# Patient Record
Sex: Male | Born: 1985 | Race: White | Hispanic: No | Marital: Married | Smoking: Never smoker
Health system: Southern US, Community
[De-identification: ages and names within clinical notes are randomized; demographics above are authoritative.]

## PROBLEM LIST (undated history)

## (undated) DIAGNOSIS — F909 Attention-deficit hyperactivity disorder, unspecified type: Secondary | ICD-10-CM

## (undated) DIAGNOSIS — F1120 Opioid dependence, uncomplicated: Secondary | ICD-10-CM

## (undated) DIAGNOSIS — J45909 Unspecified asthma, uncomplicated: Secondary | ICD-10-CM

## (undated) HISTORY — PX: TYMPANOSTOMY TUBE PLACEMENT: SHX32

## (undated) HISTORY — PX: HAND SURGERY: SHX662

## (undated) HISTORY — PX: KNEE SURGERY: SHX244

## (undated) HISTORY — PX: APPENDECTOMY: SHX54

---

## 2019-07-22 ENCOUNTER — Other Ambulatory Visit: Payer: Self-pay

## 2019-07-22 DIAGNOSIS — Z5321 Procedure and treatment not carried out due to patient leaving prior to being seen by health care provider: Secondary | ICD-10-CM | POA: Insufficient documentation

## 2019-07-22 DIAGNOSIS — R29818 Other symptoms and signs involving the nervous system: Secondary | ICD-10-CM | POA: Insufficient documentation

## 2019-07-23 ENCOUNTER — Emergency Department (HOSPITAL_COMMUNITY)
Admission: EM | Admit: 2019-07-23 | Discharge: 2019-07-23 | Disposition: A | Discharge status: Home / Self Care | Payer: Self-pay | Attending: Emergency Medicine | Admitting: Emergency Medicine

## 2019-07-23 NOTE — ED Notes (Signed)
Unable to locate patient multiple times by staff at waiting area.  

## 2019-07-23 NOTE — ED Notes (Signed)
Pt called not found in waiting room

## 2019-07-29 ENCOUNTER — Emergency Department (HOSPITAL_BASED_OUTPATIENT_CLINIC_OR_DEPARTMENT_OTHER): Payer: No Typology Code available for payment source

## 2019-07-29 ENCOUNTER — Emergency Department (HOSPITAL_BASED_OUTPATIENT_CLINIC_OR_DEPARTMENT_OTHER)
Admission: EM | Admit: 2019-07-29 | Discharge: 2019-07-30 | Disposition: A | Discharge status: Home / Self Care | Payer: No Typology Code available for payment source | Attending: Emergency Medicine | Admitting: Emergency Medicine

## 2019-07-29 ENCOUNTER — Encounter (HOSPITAL_BASED_OUTPATIENT_CLINIC_OR_DEPARTMENT_OTHER): Payer: Self-pay | Admitting: Emergency Medicine

## 2019-07-29 ENCOUNTER — Other Ambulatory Visit: Payer: Self-pay

## 2019-07-29 DIAGNOSIS — F17220 Nicotine dependence, chewing tobacco, uncomplicated: Secondary | ICD-10-CM | POA: Insufficient documentation

## 2019-07-29 DIAGNOSIS — S42021A Displaced fracture of shaft of right clavicle, initial encounter for closed fracture: Secondary | ICD-10-CM | POA: Diagnosis not present

## 2019-07-29 DIAGNOSIS — Y92410 Unspecified street and highway as the place of occurrence of the external cause: Secondary | ICD-10-CM | POA: Diagnosis not present

## 2019-07-29 DIAGNOSIS — Y999 Unspecified external cause status: Secondary | ICD-10-CM | POA: Insufficient documentation

## 2019-07-29 DIAGNOSIS — S70211A Abrasion, right hip, initial encounter: Secondary | ICD-10-CM | POA: Diagnosis not present

## 2019-07-29 DIAGNOSIS — Y939 Activity, unspecified: Secondary | ICD-10-CM | POA: Insufficient documentation

## 2019-07-29 DIAGNOSIS — S301XXA Contusion of abdominal wall, initial encounter: Secondary | ICD-10-CM | POA: Insufficient documentation

## 2019-07-29 DIAGNOSIS — S50311A Abrasion of right elbow, initial encounter: Secondary | ICD-10-CM | POA: Insufficient documentation

## 2019-07-29 DIAGNOSIS — T07XXXA Unspecified multiple injuries, initial encounter: Secondary | ICD-10-CM

## 2019-07-29 DIAGNOSIS — R079 Chest pain, unspecified: Secondary | ICD-10-CM | POA: Insufficient documentation

## 2019-07-29 DIAGNOSIS — S4991XA Unspecified injury of right shoulder and upper arm, initial encounter: Secondary | ICD-10-CM | POA: Diagnosis present

## 2019-07-29 LAB — CBC WITH DIFFERENTIAL/PLATELET
Abs Immature Granulocytes: 0.08 10*3/uL — ABNORMAL HIGH (ref 0.00–0.07)
Basophils Absolute: 0.1 10*3/uL (ref 0.0–0.1)
Basophils Relative: 1 %
Eosinophils Absolute: 0.1 10*3/uL (ref 0.0–0.5)
Eosinophils Relative: 1 %
HCT: 48.2 % (ref 39.0–52.0)
Hemoglobin: 15.9 g/dL (ref 13.0–17.0)
Immature Granulocytes: 1 %
Lymphocytes Relative: 19 %
Lymphs Abs: 2 10*3/uL (ref 0.7–4.0)
MCH: 29 pg (ref 26.0–34.0)
MCHC: 33 g/dL (ref 30.0–36.0)
MCV: 87.8 fL (ref 80.0–100.0)
Monocytes Absolute: 1 10*3/uL (ref 0.1–1.0)
Monocytes Relative: 9 %
Neutro Abs: 7.3 10*3/uL (ref 1.7–7.7)
Neutrophils Relative %: 69 %
Platelets: 215 10*3/uL (ref 150–400)
RBC: 5.49 MIL/uL (ref 4.22–5.81)
RDW: 12.5 % (ref 11.5–15.5)
WBC: 10.5 10*3/uL (ref 4.0–10.5)
nRBC: 0 % (ref 0.0–0.2)

## 2019-07-29 LAB — COMPREHENSIVE METABOLIC PANEL
ALT: 69 U/L — ABNORMAL HIGH (ref 0–44)
AST: 36 U/L (ref 15–41)
Albumin: 4.1 g/dL (ref 3.5–5.0)
Alkaline Phosphatase: 57 U/L (ref 38–126)
Anion gap: 10 (ref 5–15)
BUN: 18 mg/dL (ref 6–20)
CO2: 25 mmol/L (ref 22–32)
Calcium: 8.9 mg/dL (ref 8.9–10.3)
Chloride: 103 mmol/L (ref 98–111)
Creatinine, Ser: 1.03 mg/dL (ref 0.61–1.24)
GFR calc Af Amer: >60 mL/min (ref >60)
GFR calc non Af Amer: >60 mL/min (ref >60)
Glucose, Bld: 78 mg/dL (ref 70–99)
Potassium: 4.4 mmol/L (ref 3.5–5.1)
Sodium: 138 mmol/L (ref 135–145)
Total Bilirubin: 0.4 mg/dL (ref 0.3–1.2)
Total Protein: 7.7 g/dL (ref 6.5–8.1)

## 2019-07-29 LAB — CBG MONITORING, ED: Glucose-Capillary: 101 mg/dL — ABNORMAL HIGH (ref 70–99)

## 2019-07-29 MED ORDER — METHOCARBAMOL 500 MG PO TABS: 500.0000 mg | ORAL_TABLET | Freq: Two times a day (BID) | ORAL | 0 refills | Status: AC | PRN

## 2019-07-29 MED ORDER — FENTANYL CITRATE (PF) 100 MCG/2ML IJ SOLN
100.0000 ug | Freq: Once | INTRAMUSCULAR | Status: AC
  Administered 2019-07-29: 100 ug via INTRAVENOUS

## 2019-07-29 MED ORDER — SODIUM CHLORIDE 0.9 % IV SOLN: Freq: Once | INTRAVENOUS | Status: AC

## 2019-07-29 MED ORDER — HYDROMORPHONE HCL 1 MG/ML IJ SOLN
1.0000 mg | Freq: Once | INTRAMUSCULAR | Status: AC
  Administered 2019-07-29: 1 mg via INTRAVENOUS
  Filled 2019-07-29: qty 1

## 2019-07-29 MED ORDER — IOHEXOL 300 MG/ML  SOLN
70.0000 mL | Freq: Once | INTRAMUSCULAR | Status: AC | PRN
  Administered 2019-07-29: 70 mL via INTRAVENOUS

## 2019-07-29 MED ORDER — HYDROMORPHONE HCL 1 MG/ML IJ SOLN
0.5000 mg | Freq: Once | INTRAMUSCULAR | Status: AC
  Administered 2019-07-29: 0.5 mg via INTRAVENOUS
  Filled 2019-07-29: qty 1

## 2019-07-29 MED ORDER — SODIUM CHLORIDE 0.9 % IV BOLUS
1000.0000 mL | Freq: Once | INTRAVENOUS | Status: AC
  Administered 2019-07-29: 1000 mL via INTRAVENOUS

## 2019-07-29 MED ORDER — KETOROLAC TROMETHAMINE 30 MG/ML IJ SOLN
30.0000 mg | Freq: Once | INTRAMUSCULAR | Status: AC
  Administered 2019-07-29: 30 mg via INTRAVENOUS
  Filled 2019-07-29: qty 1

## 2019-07-29 MED ORDER — IOHEXOL 300 MG/ML  SOLN: 100.0000 mL | Freq: Once | INTRAMUSCULAR | Status: DC | PRN

## 2019-07-29 MED ORDER — IOHEXOL 300 MG/ML  SOLN
100.0000 mL | Freq: Once | INTRAMUSCULAR | Status: AC | PRN
  Administered 2019-07-29: 80 mL via INTRAVENOUS

## 2019-07-29 MED ORDER — FENTANYL CITRATE (PF) 100 MCG/2ML IJ SOLN
100.0000 ug | Freq: Once | INTRAMUSCULAR | Status: DC
  Filled 2019-07-29: qty 2

## 2019-07-29 MED ORDER — FENTANYL CITRATE (PF) 100 MCG/2ML IJ SOLN: 50.0000 ug | Freq: Once | INTRAMUSCULAR | Status: DC

## 2019-07-29 MED ORDER — HYDROCODONE-ACETAMINOPHEN 5-325 MG PO TABS: 1.0000 | ORAL_TABLET | Freq: Four times a day (QID) | ORAL | 0 refills | Status: AC | PRN

## 2019-07-29 NOTE — Discharge Instructions (Addendum)
Take ibuprofen 3 times a day with meals.  Do not take other anti-inflammatories at the same time (Advil, Motrin, naproxen, Aleve). You may supplement with Tylenol if you need further pain control. Use Norco as needed for severe breakthrough pain.  Have caution, this may make you tired or groggy.  Do not drive or operate heavy machinery while taking this medicine. Use robaxin as needed for muscle stiffness or soreness.  This may also make you tired/groggy, I recommend you separate this medicine and Norco by several hours.   Use ice packs for pain and swelling. Wash the abrasions with soap and water daily.  You may keep uncovered, or apply a nonstick dressing. Call the orthopedic doctor listed below for follow up regarding your collar bone fracture.  You will likely have continued muscle stiffness and soreness over the next couple days.  Return to the emergency room if you develop vision changes, vomiting, slurred speech, numbness, loss of bowel or bladder control, or any new or worsening symptoms.

## 2019-07-29 NOTE — ED Notes (Signed)
Attempt x 1 for piv left AC unsuccessful. Jon Gills, RN at bedside to attempt [place] piv placement

## 2019-07-29 NOTE — ED Notes (Signed)
Pt sitting on side of bed having immobilizer placed. C/o 9/10 pain. Medicated per MD order. Noted to be pale, diaphoretic. States "I don't feel good". EDPA Sophia made aware
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
## 2019-07-29 NOTE — ED Notes (Signed)
ED Provider at bedside; Sophia, EDPA

## 2019-07-29 NOTE — ED Notes (Signed)
Pt c/o increasing pain and SOB. MD Pfeiffer made aware

## 2019-07-29 NOTE — ED Notes (Signed)
ED Provider at bedside. (PA student) 

## 2019-07-29 NOTE — ED Notes (Signed)
Patient transported to radiology

## 2019-07-29 NOTE — ED Notes (Signed)
Dr. Donnald Garre ED Provider at bedside.
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
## 2019-08-01 ENCOUNTER — Ambulatory Visit: Payer: Self-pay | Admitting: Orthopaedic Surgery

## 2019-08-02 ENCOUNTER — Encounter: Payer: Self-pay | Admitting: Orthopaedic Surgery

## 2019-08-02 ENCOUNTER — Ambulatory Visit: Payer: Self-pay

## 2019-08-02 ENCOUNTER — Ambulatory Visit (INDEPENDENT_AMBULATORY_CARE_PROVIDER_SITE_OTHER): Payer: Self-pay | Admitting: Orthopaedic Surgery

## 2019-08-02 DIAGNOSIS — S42021A Displaced fracture of shaft of right clavicle, initial encounter for closed fracture: Secondary | ICD-10-CM

## 2019-08-02 MED ORDER — HYDROCODONE-ACETAMINOPHEN 5-325 MG PO TABS: 1.0000 | ORAL_TABLET | Freq: Two times a day (BID) | ORAL | 0 refills | Status: AC | PRN
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
## 2019-08-03 ENCOUNTER — Other Ambulatory Visit: Payer: Self-pay | Admitting: Family

## 2019-08-07 ENCOUNTER — Encounter (HOSPITAL_COMMUNITY): Payer: Self-pay | Admitting: Orthopaedic Surgery

## 2019-08-07 ENCOUNTER — Other Ambulatory Visit: Payer: Self-pay

## 2019-08-07 DIAGNOSIS — S42021A Displaced fracture of shaft of right clavicle, initial encounter for closed fracture: Secondary | ICD-10-CM

## 2019-08-07 NOTE — Progress Notes (Signed)
Spoke with pt for pre-op call. Pt denies cardiac history, HTN or Diabetes.   Pt will need a Covid test on arrival.   ERAS protocol ordered, pt instructed not to eat food after midnight, but may have clear liquids until 7:30 AM. Reviewed list of clear liquids with pt and he voiced understanding. Pt lives in [place] and cannot come today to to pick up RX due to distance

## 2019-08-07 NOTE — Discharge Instructions (Signed)

## 2019-08-08 ENCOUNTER — Ambulatory Visit (HOSPITAL_COMMUNITY): Payer: Self-pay | Admitting: Anesthesiology

## 2019-08-08 ENCOUNTER — Ambulatory Visit (HOSPITAL_COMMUNITY)
Admission: RE | Admit: 2019-08-08 | Discharge: 2019-08-08 | Disposition: A | Discharge status: Home / Self Care | Payer: Self-pay | Attending: Orthopaedic Surgery | Admitting: Orthopaedic Surgery

## 2019-08-08 ENCOUNTER — Encounter (HOSPITAL_COMMUNITY): Payer: Self-pay | Admitting: Orthopaedic Surgery

## 2019-08-08 ENCOUNTER — Encounter (HOSPITAL_COMMUNITY)
Admission: RE | Disposition: A | Discharge status: Home / Self Care | Payer: Self-pay | Source: Home / Self Care | Attending: Orthopaedic Surgery

## 2019-08-08 ENCOUNTER — Other Ambulatory Visit: Payer: Self-pay

## 2019-08-08 ENCOUNTER — Ambulatory Visit (HOSPITAL_COMMUNITY): Payer: Self-pay

## 2019-08-08 DIAGNOSIS — Z419 Encounter for procedure for purposes other than remedying health state, unspecified: Secondary | ICD-10-CM

## 2019-08-08 DIAGNOSIS — F909 Attention-deficit hyperactivity disorder, unspecified type: Secondary | ICD-10-CM | POA: Insufficient documentation

## 2019-08-08 DIAGNOSIS — Z20822 Contact with and (suspected) exposure to covid-19: Secondary | ICD-10-CM | POA: Insufficient documentation

## 2019-08-08 DIAGNOSIS — J45909 Unspecified asthma, uncomplicated: Secondary | ICD-10-CM | POA: Insufficient documentation

## 2019-08-08 DIAGNOSIS — Z79899 Other long term (current) drug therapy: Secondary | ICD-10-CM | POA: Insufficient documentation

## 2019-08-08 DIAGNOSIS — Z9889 Other specified postprocedural states: Secondary | ICD-10-CM

## 2019-08-08 DIAGNOSIS — S42021A Displaced fracture of shaft of right clavicle, initial encounter for closed fracture: Secondary | ICD-10-CM

## 2019-08-08 DIAGNOSIS — S42031A Displaced fracture of lateral end of right clavicle, initial encounter for closed fracture: Secondary | ICD-10-CM | POA: Insufficient documentation

## 2019-08-08 DIAGNOSIS — X58XXXA Exposure to other specified factors, initial encounter: Secondary | ICD-10-CM | POA: Insufficient documentation

## 2019-08-08 HISTORY — PX: ORIF CLAVICULAR FRACTURE: SHX5055

## 2019-08-08 HISTORY — DX: Attention-deficit hyperactivity disorder, unspecified type: F90.9

## 2019-08-08 HISTORY — DX: Unspecified asthma, uncomplicated: J45.909

## 2019-08-08 LAB — SARS CORONAVIRUS 2 BY RT PCR (HOSPITAL ORDER, PERFORMED IN ~~LOC~~ HOSPITAL LAB): SARS Coronavirus 2: NEGATIVE

## 2019-08-08 SURGERY — OPEN REDUCTION INTERNAL FIXATION (ORIF) CLAVICULAR FRACTURE
Anesthesia: General | Laterality: Right

## 2019-08-08 MED ORDER — METHOCARBAMOL 500 MG PO TABS
ORAL_TABLET | ORAL | Status: AC
  Filled 2019-08-08: qty 1

## 2019-08-08 MED ORDER — DEXAMETHASONE SODIUM PHOSPHATE 10 MG/ML IJ SOLN
INTRAMUSCULAR | Status: DC | PRN
  Administered 2019-08-08: 5 mg via INTRAVENOUS

## 2019-08-08 MED ORDER — SODIUM CHLORIDE 0.9 % IR SOLN
Status: DC | PRN
  Administered 2019-08-08: 1000 mL

## 2019-08-08 MED ORDER — FENTANYL CITRATE (PF) 100 MCG/2ML IJ SOLN
INTRAMUSCULAR | Status: AC
  Filled 2019-08-08: qty 2

## 2019-08-08 MED ORDER — FENTANYL CITRATE (PF) 250 MCG/5ML IJ SOLN
INTRAMUSCULAR | Status: DC | PRN
  Administered 2019-08-08 (×2): 50 ug via INTRAVENOUS
  Administered 2019-08-08: 100 ug via INTRAVENOUS
  Administered 2019-08-08: 50 ug via INTRAVENOUS

## 2019-08-08 MED ORDER — PROPOFOL 10 MG/ML IV BOLUS
INTRAVENOUS | Status: DC | PRN
  Administered 2019-08-08: 150 mg via INTRAVENOUS

## 2019-08-08 MED ORDER — CELECOXIB 200 MG PO CAPS: 200.0000 mg | ORAL_CAPSULE | Freq: Once | ORAL | Status: DC

## 2019-08-08 MED ORDER — TRANEXAMIC ACID 1000 MG/10ML IV SOLN
INTRAVENOUS | Status: DC | PRN
  Administered 2019-08-08: 2000 mg via TOPICAL

## 2019-08-08 MED ORDER — ONDANSETRON HCL 4 MG/2ML IJ SOLN
INTRAMUSCULAR | Status: DC | PRN
  Administered 2019-08-08: 4 mg via INTRAVENOUS

## 2019-08-08 MED ORDER — ROCURONIUM BROMIDE 10 MG/ML (PF) SYRINGE
PREFILLED_SYRINGE | INTRAVENOUS | Status: DC | PRN
  Administered 2019-08-08: 20 mg via INTRAVENOUS
  Administered 2019-08-08: 50 mg via INTRAVENOUS

## 2019-08-08 MED ORDER — AMISULPRIDE (ANTIEMETIC) 5 MG/2ML IV SOLN: 10.0000 mg | Freq: Once | INTRAVENOUS | Status: DC | PRN

## 2019-08-08 MED ORDER — LACTATED RINGERS IV SOLN: INTRAVENOUS | Status: DC

## 2019-08-08 MED ORDER — BUPIVACAINE HCL (PF) 0.25 % IJ SOLN
INTRAMUSCULAR | Status: AC
  Filled 2019-08-08: qty 30

## 2019-08-08 MED ORDER — CHLORHEXIDINE GLUCONATE 0.12 % MT SOLN
15.0000 mL | Freq: Once | OROMUCOSAL | Status: AC
  Administered 2019-08-08: 15 mL via OROMUCOSAL
  Filled 2019-08-08: qty 15

## 2019-08-08 MED ORDER — OXYCODONE-ACETAMINOPHEN 5-325 MG PO TABS: 1.0000 | ORAL_TABLET | Freq: Three times a day (TID) | ORAL | 0 refills | Status: AC | PRN

## 2019-08-08 MED ORDER — MIDAZOLAM HCL 2 MG/2ML IJ SOLN
INTRAMUSCULAR | Status: DC | PRN
  Administered 2019-08-08: 2 mg via INTRAVENOUS

## 2019-08-08 MED ORDER — TRANEXAMIC ACID 1000 MG/10ML IV SOLN
2000.0000 mg | Freq: Once | INTRAVENOUS | Status: DC
  Filled 2019-08-08: qty 20

## 2019-08-08 MED ORDER — BUPIVACAINE HCL (PF) 0.25 % IJ SOLN
INTRAMUSCULAR | Status: DC | PRN
  Administered 2019-08-08: 30 mL

## 2019-08-08 MED ORDER — FENTANYL CITRATE (PF) 100 MCG/2ML IJ SOLN
25.0000 ug | INTRAMUSCULAR | Status: DC | PRN
  Administered 2019-08-08: 50 ug via INTRAVENOUS
  Administered 2019-08-08: 25 ug via INTRAVENOUS

## 2019-08-08 MED ORDER — IBUPROFEN 800 MG PO TABS
800.0000 mg | ORAL_TABLET | Freq: Three times a day (TID) | ORAL | 2 refills | Status: AC | PRN
Start: 2019-08-08 — End: ?

## 2019-08-08 MED ORDER — METHOCARBAMOL 500 MG PO TABS
500.0000 mg | ORAL_TABLET | Freq: Once | ORAL | Status: AC
  Administered 2019-08-08: 500 mg via ORAL

## 2019-08-08 MED ORDER — PROPOFOL 10 MG/ML IV BOLUS
INTRAVENOUS | Status: AC
  Filled 2019-08-08: qty 20

## 2019-08-08 MED ORDER — ORAL CARE MOUTH RINSE: 15.0000 mL | Freq: Once | OROMUCOSAL | Status: AC

## 2019-08-08 MED ORDER — MIDAZOLAM HCL 2 MG/2ML IJ SOLN
INTRAMUSCULAR | Status: AC
  Filled 2019-08-08: qty 2

## 2019-08-08 MED ORDER — CEFAZOLIN SODIUM-DEXTROSE 2-4 GM/100ML-% IV SOLN
2.0000 g | INTRAVENOUS | Status: AC
  Administered 2019-08-08: 2 g via INTRAVENOUS
  Filled 2019-08-08: qty 100

## 2019-08-08 MED ORDER — FENTANYL CITRATE (PF) 250 MCG/5ML IJ SOLN
INTRAMUSCULAR | Status: AC
  Filled 2019-08-08: qty 5

## 2019-08-08 MED ORDER — METHOCARBAMOL 750 MG PO TABS: 750.0000 mg | ORAL_TABLET | Freq: Two times a day (BID) | ORAL | 3 refills | Status: AC | PRN

## 2019-08-08 MED ORDER — SUGAMMADEX SODIUM 200 MG/2ML IV SOLN
INTRAVENOUS | Status: DC | PRN
  Administered 2019-08-08: 300 mg via INTRAVENOUS

## 2019-08-08 MED ORDER — LIDOCAINE 2% (20 MG/ML) 5 ML SYRINGE
INTRAMUSCULAR | Status: DC | PRN
  Administered 2019-08-08: 60 mg via INTRAVENOUS

## 2019-08-08 MED ORDER — DEXMEDETOMIDINE HCL IN NACL 200 MCG/50ML IV SOLN
INTRAVENOUS | Status: DC | PRN
Start: 2019-08-08 — End: 2019-08-08
  Administered 2019-08-08 (×2): 12 ug via INTRAVENOUS

## 2019-08-08 SURGICAL SUPPLY — 42 items
BIT DRILL CLAV 2.2X135 (BIT) ×2
BIT DRILL CLAV ALPS 2.7X145 (BIT) ×3 IMPLANT
BIT DRILL CLAV LONG 2.2X135 (BIT) ×1 IMPLANT
COVER SURGICAL LIGHT HANDLE (MISCELLANEOUS) ×3 IMPLANT
DRAPE C-ARM 42X72 X-RAY (DRAPES) ×3 IMPLANT
DRAPE U-SHAPE 47X51 STRL (DRAPES) ×3 IMPLANT
DURAPREP 26ML APPLICATOR (WOUND CARE) ×6 IMPLANT
ELECT REM PT RETURN 9FT ADLT (ELECTROSURGICAL) ×3
ELECTRODE REM PT RTRN 9FT ADLT (ELECTROSURGICAL) ×1 IMPLANT
GAUZE SPONGE 4X4 12PLY STRL (GAUZE/BANDAGES/DRESSINGS) ×3 IMPLANT
GLOVE BIOGEL PI IND STRL 7.0 (GLOVE) ×1 IMPLANT
GLOVE BIOGEL PI INDICATOR 7.0 (GLOVE) ×2
GLOVE ECLIPSE 7.0 STRL STRAW (GLOVE) ×3 IMPLANT
GLOVE SKINSENSE NS SZ7.5 (GLOVE) ×4
GLOVE SKINSENSE STRL SZ7.5 (GLOVE) ×2 IMPLANT
GOWN STRL REIN XL XLG (GOWN DISPOSABLE) ×6 IMPLANT
K-WIRE TROCHAR TIP ALPS 1.6 (WIRE) ×6
KIT BASIN OR (CUSTOM PROCEDURE TRAY) ×3 IMPLANT
KIT TURNOVER KIT B (KITS) ×3 IMPLANT
KWIRE TROCHAR TIP ALPS 1.6 (WIRE) ×2 IMPLANT
MANIFOLD NEPTUNE II (INSTRUMENTS) ×3 IMPLANT
NS IRRIG 1000ML POUR BTL (IV SOLUTION) ×3 IMPLANT
PACK SHOULDER (CUSTOM PROCEDURE TRAY) ×3 IMPLANT
PLATE CLAV SUP 110 10H RT (Plate) ×3 IMPLANT
SCREW  LP NL 2.7X16MM (Screw) ×2 IMPLANT
SCREW CORT LP 3.5X14 (Screw) ×9 IMPLANT
SCREW CORT LP T15 3.5X16 (Screw) ×3 IMPLANT
SCREW LP NL 2.7X16MM (Screw) ×1 IMPLANT
SCREW TIS LP 3.5X18 NS (Screw) ×9 IMPLANT
SLING ARM FOAM STRAP LRG (SOFTGOODS) ×3 IMPLANT
SPONGE LAP 18X18 X RAY DECT (DISPOSABLE) ×3 IMPLANT
SUCTION FRAZIER HANDLE 10FR (MISCELLANEOUS) ×2
SUCTION TUBE FRAZIER 10FR DISP (MISCELLANEOUS) ×1 IMPLANT
SUT MNCRL AB 3-0 PS2 27 (SUTURE) ×3 IMPLANT
SUT MNCRL AB 4-0 PS2 18 (SUTURE) ×3 IMPLANT
SUT VIC AB 0 CT1 27 (SUTURE) ×2
SUT VIC AB 0 CT1 27XBRD ANBCTR (SUTURE) ×1 IMPLANT
SUT VIC AB 2-0 CT1 27 (SUTURE) ×6
SUT VIC AB 2-0 CT1 TAPERPNT 27 (SUTURE) ×3 IMPLANT
SYR CONTROL 10ML LL (SYRINGE) ×3 IMPLANT
TAPE STRIPS DRAPE STRL (GAUZE/BANDAGES/DRESSINGS) ×3 IMPLANT
UNDERPAD 30X36 HEAVY ABSORB (UNDERPADS AND DIAPERS) ×3 IMPLANT

## 2019-08-08 NOTE — H&P (Signed)
PREOPERATIVE H&P  Chief Complaint: right clavicle fracture  HPI: Joel Hudson is a 34 y.o. male who presents for surgical treatment of right clavicle fracture.  He denies any changes in medical history.  Past Medical History:  Diagnosis Date  . ADHD   . Asthma    as a small child   Past Surgical History:  Procedure Laterality Date  . APPENDECTOMY    . HAND SURGERY Left    fracture   . KNEE SURGERY    . TYMPANOSTOMY TUBE PLACEMENT     Social History   Socioeconomic History  . Marital status: Married    Spouse name: Not on file  . Number of children: Not on file  . Years of education: Not on file  . Highest education level: Not on file  Occupational History  . Not on file  Tobacco Use  . Smoking status: Never Smoker  . Smokeless tobacco: Current User    Types: Snuff  Vaping Use  . Vaping Use: Never used  Substance and Sexual Activity  . Alcohol use: Yes    Comment: occ  . Drug use: Never  . Sexual activity: Not on file  Other Topics Concern  . Not on file  Social History Narrative  . Not on file   Social Determinants of Health   Financial Resource Strain:   . Difficulty of Paying Living Expenses:   Food Insecurity:   . Worried About Programme researcher, broadcasting/film/video in the Last Year:   . Barista in the Last Year:   Transportation Needs:   . Freight forwarder (Medical):   Marland Kitchen Lack of Transportation (Non-Medical):   Physical Activity:   . Days of Exercise per Week:   . Minutes of Exercise per Session:   Stress:   . Feeling of Stress :   Social Connections:   . Frequency of Communication with Friends and Family:   . Frequency of Social Gatherings with Friends and Family:   . Attends Religious Services:   . Active Member of Clubs or Organizations:   . Attends Banker Meetings:   Marland Kitchen Marital Status:    History reviewed. No pertinent family history. No Known Allergies Prior to Admission medications   Medication Sig Start Date End Date  Taking? Authorizing Provider  amphetamine-dextroamphetamine (ADDERALL) 20 MG tablet Take 20 mg by mouth daily as needed (extra focus).   Yes [provider]  HYDROcodone-acetaminophen (NORCO/VICODIN) 5-325 MG tablet Take 1 tablet by mouth every 6 (six) hours as needed for severe pain. 07/29/19  Yes Caccavale, Sophia, PA-C  methocarbamol (ROBAXIN) 500 MG tablet Take 1 tablet (500 mg total) by mouth 2 (two) times daily as needed for muscle spasms. 07/29/19  Yes Caccavale, Sophia, PA-C  HYDROcodone-acetaminophen (NORCO) 5-325 MG tablet Take 1-2 tablets by mouth 2 (two) times daily as needed. 08/02/19   Tarry Kos, MD     Positive ROS: All other systems have been reviewed and were otherwise negative with the exception of those mentioned in the HPI and as above.  Physical Exam: General: Alert, no acute distress Cardiovascular: No pedal edema Respiratory: No cyanosis, no use of accessory musculature GI: abdomen soft Skin: No lesions in the area of chief complaint Neurologic: Sensation intact distally Psychiatric: Patient is competent for consent with normal mood and affect Lymphatic: no lymphedema  MUSCULOSKELETAL: exam stable  Assessment: right clavicle fracture  Plan: Plan for Procedure(s): OPEN REDUCTION INTERNAL FIXATION (ORIF) RIGHT CLAVICLE FRACTURE  The  risks benefits and alternatives were discussed with the patient including but not limited to the risks of nonoperative treatment, versus surgical intervention including infection, bleeding, nerve injury,  blood clots, cardiopulmonary complications, morbidity, mortality, among others, and they were willing to proceed.   Preoperative templating of the joint replacement has been completed, documented, and submitted to the Operating Room personnel in order to optimize intra-operative equipment management.   Glee Arvin, MD 08/08/2019 7:22 AM

## 2019-08-08 NOTE — Anesthesia Procedure Notes (Signed)
Procedure Name: Intubation Date/Time: 08/08/2019 11:08 AM Performed by: Drema Pry, CRNA Pre-anesthesia Checklist: Patient identified, Emergency Drugs available, Suction available and Patient being monitored Patient Re-evaluated:Patient Re-evaluated prior to induction Oxygen Delivery Method: Circle System Utilized Preoxygenation: Pre-oxygenation with 100% oxygen Induction Type: IV induction Ventilation: Oral airway inserted - appropriate to patient size Laryngoscope Size: Glidescope Grade View: Grade I Tube type: Oral Tube size: 7.5 mm Number of attempts: 1 Airway Equipment and Method: Oral airway,  Video-laryngoscopy and Stylet Placement Confirmation: ETT inserted through vocal cords under direct vision,  positive ETCO2 and breath sounds checked- equal and bilateral Secured at: 22 cm Tube secured with: Tape Dental Injury: Teeth and Oropharynx as per pre-operative assessment  Comments: Elective Glidescope for educational purposes. Intubation by L. Ferm, SRNA

## 2019-08-08 NOTE — Anesthesia Preprocedure Evaluation (Signed)
Anesthesia Evaluation  Patient identified by MRN, date of birth, ID band Patient awake    Reviewed: Allergy & Precautions, NPO status , Patient's Chart, lab work & pertinent test results  Airway Mallampati: II  TM Distance: >3 FB Neck ROM: Full    Dental  (+) Dental Advisory Given   Pulmonary asthma ,    breath sounds clear to auscultation       Cardiovascular negative cardio ROS   Rhythm:Regular Rate:Normal     Neuro/Psych negative neurological ROS     GI/Hepatic negative GI ROS, Neg liver ROS,   Endo/Other  negative endocrine ROS  Renal/GU negative Renal ROS     Musculoskeletal   Abdominal   Peds  Hematology negative hematology ROS (+)   Anesthesia Other Findings   Reproductive/Obstetrics                             Anesthesia Physical Anesthesia Plan  ASA: I  Anesthesia Plan: General   Post-op Pain Management:    Induction: Intravenous  PONV Risk Score and Plan: 2 and Dexamethasone, Ondansetron and Treatment may vary due to age or medical condition  Airway Management Planned: Oral ETT  Additional Equipment: None  Intra-op Plan:   Post-operative Plan: Extubation in OR  Informed Consent: I have reviewed the patients History and Physical, chart, labs and discussed the procedure including the risks, benefits and alternatives for the proposed anesthesia with the patient or authorized representative who has indicated his/her understanding and acceptance.     Dental advisory given  Plan Discussed with: CRNA  Anesthesia Plan Comments:         Anesthesia Quick Evaluation

## 2019-08-08 NOTE — Transfer of Care (Signed)
Immediate Anesthesia Transfer of Care Note  Patient: Joel Hudson  Procedure(s) Performed: OPEN REDUCTION INTERNAL FIXATION (ORIF) RIGHT CLAVICLE FRACTURE (Right )  Patient Location: PACU  Anesthesia Type:General  Level of Consciousness: drowsy, patient cooperative and responds to stimulation  Airway & Oxygen Therapy: Patient Spontanous Breathing  Post-op Assessment: Report given to RN and Post -op Vital signs reviewed and stable  Post vital signs: Reviewed and stable  Last Vitals:  Vitals Value Taken Time  BP 132/96 08/08/19 1313  Temp    Pulse 87 08/08/19 1314  Resp 17 08/08/19 1314  SpO2 99 % 08/08/19 1314  Vitals shown include unvalidated device data.  Last Pain:  Vitals:   08/08/19 0858  TempSrc:   PainSc: 5          Complications: No complications documented.

## 2019-08-08 NOTE — Op Note (Signed)
   Date of Surgery: 08/08/2019  INDICATIONS: Mr. Coppa is a 34 y.o.-year-old male who sustained a right clavicle fracture;  The patient did consent to the procedure after discussion of the risks and benefits.  PREOPERATIVE DIAGNOSIS: Displaced communited right clavicle fracture  POSTOPERATIVE DIAGNOSIS: Same.  PROCEDURE: Open treatment of right clavicle fracture with internal fixation  SURGEON: N. Glee Arvin, M.D.  ASSIST: Starlyn Skeans Eek, [place]; necessary for the timely completion of procedure and due to complexity of procedure..  ANESTHESIA:  general, local  IV FLUIDS AND URINE: See anesthesia.  ESTIMATED BLOOD LOSS: minimal mL.  IMPLANTS: Biomet superior plate  COMPLICATIONS: None.  DESCRIPTION OF PROCEDURE: The patient was brought to the operating room and placed supine on the operating table.  The patient had been signed prior to the procedure and this was documented. The patient had the anesthesia placed by the anesthesiologist.  The patient was then placed in the beach chair position.  All bony prominences were well padded.  A time-out was performed to confirm that this was the correct patient, site, side and location. The patient had an SCD on the lower extremities. The patient did receive antibiotics prior to the incision and was re-dosed during the procedure as needed at indicated intervals.  The patient had the operative extremity prepped and draped in the standard surgical fashion.    A horizontal incision based over the clavicle was used.  Cutaneous nerves were identified and protected as much as possible.  Full thickness flaps were elevated off of the clavicle.  The fracture was exposed.  Any organized hematoma and entrapped periosteum was retrieved from the fracture site.  There was a large anterior butterfly fragment that I lagged back to the distal segment using a 2.7 millimeters screw which gave excellent fixation.  I then obtained a reduction with the medial segment  using a tenaculum clamp.  I was not able to place the lag screw but the fracture stayed reduced.  A 10 hole superior plate was then placed on top of the clavicle and checked under fluoroscopy..  The appropriate length was found by using fluoroscopy.  With the plate in the appropriate position and the fracture reduced.  Nonlocking screws were placed through the plate and into the clavicle.  Care was taken not to plunge with any of the instruments.  Retractors were used as added protection to the neurovascular and pulmonary structures.  Final fluoroscopy pictures were taken to confirm plate placement and fracture reduction.  The wound was thoroughly irrigated and closed in a layer fashion using 0 vicryl, 2.0 vicryl and 3.0 monocryl.  Sterile dressings were applied and the patient was extubated and transferred to the PACU in stable condition.  POSTOPERATIVE PLAN: Patient will be in a sling for comfort.  He is allowed to range his shoulder up to the level of the shoulder and not allowed to lift anything.  Observation overnight for pain control and discharge home in the morning.  Mayra Reel, MD [facility] 12:37 PM

## 2019-08-09 ENCOUNTER — Encounter (HOSPITAL_COMMUNITY): Payer: Self-pay | Admitting: Orthopaedic Surgery

## 2019-08-09 NOTE — Anesthesia Postprocedure Evaluation (Signed)
Anesthesia Post Note  Patient: Joel Hudson  Procedure(s) Performed: OPEN REDUCTION INTERNAL FIXATION (ORIF) RIGHT CLAVICLE FRACTURE (Right )     Patient location during evaluation: PACU Anesthesia Type: General Level of consciousness: awake and alert Pain management: pain level controlled Vital Signs Assessment: post-procedure vital signs reviewed and stable Respiratory status: spontaneous breathing, nonlabored ventilation, respiratory function stable and patient connected to nasal cannula oxygen Cardiovascular status: blood pressure returned to baseline and stable Postop Assessment: no apparent nausea or vomiting Anesthetic complications: no   No complications documented.  Last Vitals:  Vitals:   08/08/19 1400 08/08/19 1410  BP: 122/79 125/66  Pulse: 88 89  Resp: 15 14  Temp:    SpO2: 97% 99%    Last Pain:  Vitals:   08/08/19 1400  TempSrc:   PainSc: 3                  Kennieth Rad

## 2019-08-22 ENCOUNTER — Inpatient Hospital Stay: Payer: Self-pay | Admitting: Orthopaedic Surgery

## 2019-08-28 ENCOUNTER — Encounter: Payer: Self-pay | Admitting: Orthopaedic Surgery

## 2019-08-28 ENCOUNTER — Ambulatory Visit (INDEPENDENT_AMBULATORY_CARE_PROVIDER_SITE_OTHER): Payer: Self-pay

## 2019-08-28 ENCOUNTER — Ambulatory Visit (INDEPENDENT_AMBULATORY_CARE_PROVIDER_SITE_OTHER): Payer: Self-pay | Admitting: Orthopaedic Surgery

## 2019-08-28 DIAGNOSIS — S42021A Displaced fracture of shaft of right clavicle, initial encounter for closed fracture: Secondary | ICD-10-CM

## 2019-08-28 NOTE — Progress Notes (Signed)
   Post-Op Visit Note   Patient: Joel Hudson           Date of Birth: 02-24-1985           MRN: [id number] Visit Date: 08/28/2019 PCP: Patient, No Pcp Per   Assessment & Plan:  Chief Complaint:  Chief Complaint  Patient presents with  . Right Shoulder - Pain   Visit Diagnoses:  1. Displaced fracture of shaft of right clavicle, initial encounter for closed fracture     Plan: Joel Hudson is a 34 year old gentleman who is approximately 3 weeks status post ORIF right clavicle fracture.  He reports no pain.  His surgical incision is healed with a couple of stitches to have spat out.  No signs of infection.  No drainage.  He is self-employed Geophysicist/field seismologist.  He admits to doing push-ups and being a little noncompliant with his postoperative restrictions.  I stressed the importance of being compliant in order to mitigate potential complications.  He understands that he is not allowed to lift anything more than a couple pounds or raise his arm above his head.  We will recheck him in 4 weeks with two-view x-rays of the right clavicle.  Follow-Up Instructions: Return in about 4 weeks (around 09/25/2019).   Orders:  Orders Placed This Encounter  Procedures  . XR Clavicle Right   No orders of the defined types were placed in this encounter.   Imaging: XR Clavicle Right  Result Date: 08/28/2019 Stable fixation and alignment of right clavicle fracture without hardware complications.   PMFS History: Patient Active Problem List   Diagnosis Date Noted  . Displaced fracture of shaft of right clavicle, initial encounter for closed fracture 08/07/2019   Past Medical History:  Diagnosis Date  . ADHD   . Asthma    as a small child    History reviewed. No pertinent family history.  Past Surgical History:  Procedure Laterality Date  . APPENDECTOMY    . HAND SURGERY Left    fracture   . KNEE SURGERY    . ORIF CLAVICULAR FRACTURE Right 08/08/2019   Procedure: OPEN REDUCTION INTERNAL FIXATION (ORIF)  RIGHT CLAVICLE FRACTURE;  Surgeon: Tarry Kos, MD;  Location: [facility] OR;  Service: Orthopedics;  Laterality: Right;  . TYMPANOSTOMY TUBE PLACEMENT     Social History   Occupational History  . Not on file  Tobacco Use  . Smoking status: Never Smoker  . Smokeless tobacco: Current User    Types: Snuff  Vaping Use  . Vaping Use: Never used  Substance and Sexual Activity  . Alcohol use: Yes    Comment: occ  . Drug use: Never  . Sexual activity: Not on file

## 2019-09-25 ENCOUNTER — Ambulatory Visit: Payer: Self-pay | Admitting: Orthopaedic Surgery

## 2019-11-07 ENCOUNTER — Emergency Department (HOSPITAL_BASED_OUTPATIENT_CLINIC_OR_DEPARTMENT_OTHER)
Admission: EM | Admit: 2019-11-07 | Discharge: 2019-11-07 | Disposition: A | Discharge status: Home / Self Care | Payer: Self-pay | Attending: Emergency Medicine | Admitting: Emergency Medicine

## 2019-11-07 ENCOUNTER — Other Ambulatory Visit: Payer: Self-pay

## 2019-11-07 ENCOUNTER — Encounter (HOSPITAL_BASED_OUTPATIENT_CLINIC_OR_DEPARTMENT_OTHER): Payer: Self-pay | Admitting: Emergency Medicine

## 2019-11-07 ENCOUNTER — Emergency Department (HOSPITAL_BASED_OUTPATIENT_CLINIC_OR_DEPARTMENT_OTHER): Payer: Self-pay

## 2019-11-07 DIAGNOSIS — M542 Cervicalgia: Secondary | ICD-10-CM | POA: Insufficient documentation

## 2019-11-07 DIAGNOSIS — J45909 Unspecified asthma, uncomplicated: Secondary | ICD-10-CM | POA: Insufficient documentation

## 2019-11-07 DIAGNOSIS — R519 Headache, unspecified: Secondary | ICD-10-CM | POA: Insufficient documentation

## 2019-11-07 DIAGNOSIS — R202 Paresthesia of skin: Secondary | ICD-10-CM | POA: Insufficient documentation

## 2019-11-07 DIAGNOSIS — H53141 Visual discomfort, right eye: Secondary | ICD-10-CM | POA: Insufficient documentation

## 2019-11-07 DIAGNOSIS — H53149 Visual discomfort, unspecified: Secondary | ICD-10-CM

## 2019-11-07 DIAGNOSIS — F17290 Nicotine dependence, other tobacco product, uncomplicated: Secondary | ICD-10-CM | POA: Insufficient documentation

## 2019-11-07 HISTORY — DX: Opioid dependence, uncomplicated: F11.20

## 2019-11-07 LAB — URINALYSIS, ROUTINE W REFLEX MICROSCOPIC
Bilirubin Urine: NEGATIVE
Glucose, UA: NEGATIVE mg/dL
Hgb urine dipstick: NEGATIVE
Ketones, ur: NEGATIVE mg/dL
Leukocytes,Ua: NEGATIVE
Nitrite: NEGATIVE
Protein, ur: NEGATIVE mg/dL
Specific Gravity, Urine: <1.005 — ABNORMAL LOW (ref 1.005–1.030)
pH: 7.5 (ref 5.0–8.0)

## 2019-11-07 LAB — CBC WITH DIFFERENTIAL/PLATELET
Abs Immature Granulocytes: 0.02 10*3/uL (ref 0.00–0.07)
Basophils Absolute: 0.1 10*3/uL (ref 0.0–0.1)
Basophils Relative: 1 %
Eosinophils Absolute: 0.1 10*3/uL (ref 0.0–0.5)
Eosinophils Relative: 1 %
HCT: 47.7 % (ref 39.0–52.0)
Hemoglobin: 16.1 g/dL (ref 13.0–17.0)
Immature Granulocytes: 0 %
Lymphocytes Relative: 36 %
Lymphs Abs: 2.5 10*3/uL (ref 0.7–4.0)
MCH: 28.4 pg (ref 26.0–34.0)
MCHC: 33.8 g/dL (ref 30.0–36.0)
MCV: 84.1 fL (ref 80.0–100.0)
Monocytes Absolute: 0.7 10*3/uL (ref 0.1–1.0)
Monocytes Relative: 10 %
Neutro Abs: 3.5 10*3/uL (ref 1.7–7.7)
Neutrophils Relative %: 52 %
Platelets: 238 10*3/uL (ref 150–400)
RBC: 5.67 MIL/uL (ref 4.22–5.81)
RDW: 12.5 % (ref 11.5–15.5)
WBC: 6.9 10*3/uL (ref 4.0–10.5)
nRBC: 0 % (ref 0.0–0.2)

## 2019-11-07 LAB — COMPREHENSIVE METABOLIC PANEL
ALT: 85 U/L — ABNORMAL HIGH (ref 0–44)
AST: 48 U/L — ABNORMAL HIGH (ref 15–41)
Albumin: 4.6 g/dL (ref 3.5–5.0)
Alkaline Phosphatase: 63 U/L (ref 38–126)
Anion gap: 11 (ref 5–15)
BUN: 13 mg/dL (ref 6–20)
CO2: 26 mmol/L (ref 22–32)
Calcium: 9.2 mg/dL (ref 8.9–10.3)
Chloride: 100 mmol/L (ref 98–111)
Creatinine, Ser: 1.07 mg/dL (ref 0.61–1.24)
GFR calc non Af Amer: >60 mL/min (ref >60)
Glucose, Bld: 94 mg/dL (ref 70–99)
Potassium: 3.6 mmol/L (ref 3.5–5.1)
Sodium: 137 mmol/L (ref 135–145)
Total Bilirubin: 0.7 mg/dL (ref 0.3–1.2)
Total Protein: 8.4 g/dL — ABNORMAL HIGH (ref 6.5–8.1)

## 2019-11-07 LAB — RAPID URINE DRUG SCREEN, HOSP PERFORMED
Amphetamines: POSITIVE — AB
Barbiturates: NOT DETECTED
Benzodiazepines: NOT DETECTED
Cocaine: NOT DETECTED
Opiates: NOT DETECTED
Tetrahydrocannabinol: NOT DETECTED

## 2019-11-07 LAB — TSH: TSH: 1.417 u[IU]/mL (ref 0.350–4.500)

## 2019-11-07 MED ORDER — IOHEXOL 350 MG/ML SOLN
100.0000 mL | Freq: Once | INTRAVENOUS | Status: AC | PRN
  Administered 2019-11-07: 100 mL via INTRAVENOUS

## 2019-11-07 MED ORDER — SODIUM CHLORIDE 0.9 % IV BOLUS
1000.0000 mL | Freq: Once | INTRAVENOUS | Status: AC
  Administered 2019-11-07: 1000 mL via INTRAVENOUS

## 2019-11-07 NOTE — ED Triage Notes (Addendum)
Pain to R side of head, ear, and face x 1 hour. It is painful to touch. Also reports tingling and numbness. Also reports feeling "foggy" and having varicose veins show up for several days.

## 2019-11-07 NOTE — ED Provider Notes (Signed)
[facility] EMERGENCY DEPARTMENT Provider Note   CSN: [id number] Arrival date & time: 11/07/19  [id number]     History Chief Complaint  Patient presents with  . Headache    Joel Hudson is a 34 y.o. male.  The history is provided by the patient and medical records. No language interpreter was used.  Headache Pain location:  R temporal Radiates to:  R neck Severity currently:  4/10 Pain scale at highest: severe. Onset quality:  Gradual Timing:  Intermittent Progression:  Waxing and waning Chronicity:  Chronic Similar to prior headaches: yes   Context: not exposure to bright light   Relieved by:  Nothing Worsened by:  Nothing Ineffective treatments:  None tried Associated symptoms: blurred vision, focal weakness (resolved), neck pain, numbness (resolved) and weakness (resolved)   Associated symptoms: no abdominal pain, no back pain, no congestion, no cough, no dizziness, no fatigue, no fever, no nausea, no near-syncope, no neck stiffness, no photophobia and no seizures        Past Medical History:  Diagnosis Date  . ADHD   . Asthma    as a small child    Patient Active Problem List   Diagnosis Date Noted  . Displaced fracture of shaft of right clavicle, initial encounter for closed fracture 08/07/2019    Past Surgical History:  Procedure Laterality Date  . APPENDECTOMY    . HAND SURGERY Left    fracture   . KNEE SURGERY    . ORIF CLAVICULAR FRACTURE Right 08/08/2019   Procedure: OPEN REDUCTION INTERNAL FIXATION (ORIF) RIGHT CLAVICLE FRACTURE;  Surgeon: Tarry KosXu, Naiping M, MD;  Location: [facility] OR;  Service: Orthopedics;  Laterality: Right;  . TYMPANOSTOMY TUBE PLACEMENT         No family history on file.  Social History   Tobacco Use  . Smoking status: Never Smoker  . Smokeless tobacco: Current User    Types: Snuff  Vaping Use  . Vaping Use: Never used  Substance Use Topics  . Alcohol use: Yes    Comment: occ  . Drug use: Never    Home  Medications Prior to Admission medications   Medication Sig Start Date End Date Taking? Authorizing Provider  amphetamine-dextroamphetamine (ADDERALL) 20 MG tablet Take 20 mg by mouth daily as needed (extra focus).    [provider]  HYDROcodone-acetaminophen (NORCO) 5-325 MG tablet Take 1-2 tablets by mouth 2 (two) times daily as needed. 08/02/19   Tarry KosXu, Naiping M, MD  HYDROcodone-acetaminophen (NORCO/VICODIN) 5-325 MG tablet Take 1 tablet by mouth every 6 (six) hours as needed for severe pain. 07/29/19   Caccavale, Sophia, PA-C  ibuprofen (ADVIL) 800 MG tablet Take 1 tablet (800 mg total) by mouth every 8 (eight) hours as needed. 08/08/19   Tarry KosXu, Naiping M, MD  methocarbamol (ROBAXIN) 500 MG tablet Take 1 tablet (500 mg total) by mouth 2 (two) times daily as needed for muscle spasms. 07/29/19   Caccavale, Sophia, PA-C  methocarbamol (ROBAXIN) 750 MG tablet Take 1 tablet (750 mg total) by mouth 2 (two) times daily as needed for muscle spasms. 08/08/19   Tarry KosXu, Naiping M, MD  oxyCODONE-acetaminophen (PERCOCET) 5-325 MG tablet Take 1-2 tablets by mouth every 8 (eight) hours as needed for severe pain. 08/08/19   Tarry KosXu, Naiping M, MD    Allergies    Patient has no known allergies.  Review of Systems   Review of Systems  Constitutional: Negative for chills, diaphoresis, fatigue and fever.  HENT: Negative for congestion.  Eyes: Positive for blurred vision and visual disturbance (resolved). Negative for photophobia.  Respiratory: Negative for cough, chest tightness, shortness of breath and wheezing.   Cardiovascular: Negative for chest pain and near-syncope.  Gastrointestinal: Negative for abdominal pain and nausea.  Genitourinary: Negative for dysuria.  Musculoskeletal: Positive for neck pain. Negative for back pain and neck stiffness.  Neurological: Positive for focal weakness (resolved), speech difficulty, weakness (resolved), numbness (resolved) and headaches. Negative for dizziness, seizures, syncope,  facial asymmetry and light-headedness.  Psychiatric/Behavioral: Negative for agitation and confusion.  All other systems reviewed and are negative.   Physical Exam Updated Vital Signs BP 125/84 (BP Location: Left Arm)   Pulse (!) 109   Resp 20   Ht 5\' 10"  (1.778 m)   Wt 74.8 kg   SpO2 100%   BMI 23.68 kg/m   Physical Exam Vitals and nursing note reviewed.  Constitutional:      General: He is not in acute distress.    Appearance: He is well-developed. He is not ill-appearing, toxic-appearing or diaphoretic.  HENT:     Head: Normocephalic and atraumatic.  Eyes:     General: No scleral icterus.    Extraocular Movements: Extraocular movements intact.     Right eye: Normal extraocular motion and no nystagmus.     Left eye: Normal extraocular motion and no nystagmus.     Conjunctiva/sclera: Conjunctivae normal.     Pupils: Pupils are equal, round, and reactive to light. Pupils are equal.     Comments: Patient reported the color red when visualizing his right eye was more washed out and when he visualized the same color with his left eye.  Cardiovascular:     Rate and Rhythm: Normal rate and regular rhythm.     Heart sounds: No murmur heard.   Pulmonary:     Effort: Pulmonary effort is normal. No respiratory distress.     Breath sounds: Normal breath sounds. No wheezing, rhonchi or rales.  Chest:     Chest wall: No tenderness.  Abdominal:     Palpations: Abdomen is soft.     Tenderness: There is no abdominal tenderness.  Musculoskeletal:     Cervical back: Normal range of motion and neck supple. No rigidity.  Lymphadenopathy:     Cervical: No cervical adenopathy.  Skin:    General: Skin is warm and dry.     Capillary Refill: Capillary refill takes less than 2 seconds.  Neurological:     Mental Status: He is alert and oriented to person, place, and time.     GCS: GCS eye subscore is 4. GCS verbal subscore is 5. GCS motor subscore is 6.     Cranial Nerves: No cranial nerve  deficit, dysarthria or facial asymmetry.     Sensory: No sensory deficit.     Motor: No weakness.  Psychiatric:        Mood and Affect: Mood is anxious.     ED Results / Procedures / Treatments   Labs (all labs ordered are listed, but only abnormal results are displayed) Labs Reviewed  COMPREHENSIVE METABOLIC PANEL - Abnormal; Notable for the following components:      Result Value   Total Protein 8.4 (*)    AST 48 (*)    ALT 85 (*)    All other components within normal limits  RAPID URINE DRUG SCREEN, HOSP PERFORMED - Abnormal; Notable for the following components:   Amphetamines POSITIVE (*)    All other components within normal limits  URINALYSIS, ROUTINE W REFLEX MICROSCOPIC - Abnormal; Notable for the following components:   Specific Gravity, Urine <1.005 (*)    All other components within normal limits  CBC WITH DIFFERENTIAL/PLATELET  TSH    EKG EKG Interpretation  Date/Time:  Wednesday November 07 2019 09:45:11 EDT Ventricular Rate:  97 PR Interval:  134 QRS Duration: 88 QT Interval:  330 QTC Calculation: 419 R Axis:   78 Text Interpretation: Normal sinus rhythm with sinus arrhythmia Normal ECG No prio rECG for comparison. No STEMI Confirmed by Theda Belfast ([id number]) on 11/07/2019 11:17:00 AM   Radiology CT Angio Head W or Wo Contrast  Result Date: 11/07/2019 CLINICAL DATA:  34 year old male with possible vertebral artery dissection. Headache and right neck pain. Intermittent numbness and weakness in the extremities. Transient vision and speech changes. EXAM: CT ANGIOGRAPHY HEAD AND NECK TECHNIQUE: Multidetector CT imaging of the head and neck was performed using the standard protocol during bolus administration of intravenous contrast. Multiplanar CT image reconstructions and MIPs were obtained to evaluate the vascular anatomy. Carotid stenosis measurements (when applicable) are obtained utilizing NASCET criteria, using the distal internal carotid diameter as the  denominator. CONTRAST:  OMNIPAQUE IOHEXOL 350 MG/ML SOLN COMPARISON:  Head and cervical spine CT 07/29/2019. FINDINGS: CT HEAD Brain: Stable cerebral volume. Mild asymmetry of the lateral ventricles appears to be normal anatomic variation. No midline shift, ventriculomegaly, mass effect, evidence of mass lesion, intracranial hemorrhage or evidence of cortically based acute infarction. Gray-white matter differentiation is within normal limits throughout the brain. Calvarium and skull base: No acute osseous abnormality identified. Paranasal sinuses: Visualized paranasal sinuses and mastoids are stable and well pneumatized. Orbits: Visualized orbits and scalp soft tissues are within normal limits. CTA NECK Skeleton: Stable straightening of cervical lordosis. Cervicothoracic junction alignment is within normal limits. Bilateral posterior element alignment is within normal limits. Cervical vertebrae appear stable and intact. Sequelae of right clavicle ORIF. No acute osseous abnormality identified. Upper chest: Negative. Other neck: Negative. Aortic arch: 3 vessel arch configuration.  No arch atherosclerosis. Right carotid system: Obscured bifurcation of the brachiocephalic artery due to dense right subclavian venous contrast streak. But the visible proximal right CCA, and the remainder of the cervical right carotids appear normal. Left carotid system: Normal. Vertebral arteries: Portion of the proximal right subclavian artery is also obscured by dense right subclavian venous contrast but the right vertebral artery origin is visible and normal. The right vertebral is normal to the skull base. Normal proximal left subclavian artery and left vertebral artery origin. Fairly codominant left vertebral artery is patent and normal to the skull base. CTA HEAD Posterior circulation: Normal distal vertebral arteries, PICA origins, vertebrobasilar junction. Patent basilar artery without stenosis. Patent SCA and PCA origins.  Posterior communicating arteries are diminutive or absent. Bilateral PCA branches are within normal limits. Anterior circulation: Both ICA siphons are patent and normal. Normal ophthalmic artery origins. Patent carotid termini. Normal MCA and ACA origins. Anterior communicating artery, bilateral ACA branches, and bilateral MCA branches appear normal. Venous sinuses: Patent. Prominent combined right vein of vein of Labbe and vein of Trolard suspected, normal variant. Anatomic variants: No arterial anatomic variations. Review of the MIP images confirms the above findings IMPRESSION: 1. Normal CTA head and neck. 2. Stable and normal CT appearance of the brain. 3. No acute findings identified in the neck or upper chest. Electronically Signed   By: Odessa Fleming M.D.   On: 11/07/2019 10:46   CT Angio Neck W and/or Wo Contrast  Result Date: 11/07/2019 CLINICAL DATA:  35 year old male with possible vertebral artery dissection. Headache and right neck pain. Intermittent numbness and weakness in the extremities. Transient vision and speech changes. EXAM: CT ANGIOGRAPHY HEAD AND NECK TECHNIQUE: Multidetector CT imaging of the head and neck was performed using the standard protocol during bolus administration of intravenous contrast. Multiplanar CT image reconstructions and MIPs were obtained to evaluate the vascular anatomy. Carotid stenosis measurements (when applicable) are obtained utilizing NASCET criteria, using the distal internal carotid diameter as the denominator. CONTRAST:  OMNIPAQUE IOHEXOL 350 MG/ML SOLN COMPARISON:  Head and cervical spine CT 07/29/2019. FINDINGS: CT HEAD Brain: Stable cerebral volume. Mild asymmetry of the lateral ventricles appears to be normal anatomic variation. No midline shift, ventriculomegaly, mass effect, evidence of mass lesion, intracranial hemorrhage or evidence of cortically based acute infarction. Gray-white matter differentiation is within normal limits throughout the brain.  Calvarium and skull base: No acute osseous abnormality identified. Paranasal sinuses: Visualized paranasal sinuses and mastoids are stable and well pneumatized. Orbits: Visualized orbits and scalp soft tissues are within normal limits. CTA NECK Skeleton: Stable straightening of cervical lordosis. Cervicothoracic junction alignment is within normal limits. Bilateral posterior element alignment is within normal limits. Cervical vertebrae appear stable and intact. Sequelae of right clavicle ORIF. No acute osseous abnormality identified. Upper chest: Negative. Other neck: Negative. Aortic arch: 3 vessel arch configuration.  No arch atherosclerosis. Right carotid system: Obscured bifurcation of the brachiocephalic artery due to dense right subclavian venous contrast streak. But the visible proximal right CCA, and the remainder of the cervical right carotids appear normal. Left carotid system: Normal. Vertebral arteries: Portion of the proximal right subclavian artery is also obscured by dense right subclavian venous contrast but the right vertebral artery origin is visible and normal. The right vertebral is normal to the skull base. Normal proximal left subclavian artery and left vertebral artery origin. Fairly codominant left vertebral artery is patent and normal to the skull base. CTA HEAD Posterior circulation: Normal distal vertebral arteries, PICA origins, vertebrobasilar junction. Patent basilar artery without stenosis. Patent SCA and PCA origins. Posterior communicating arteries are diminutive or absent. Bilateral PCA branches are within normal limits. Anterior circulation: Both ICA siphons are patent and normal. Normal ophthalmic artery origins. Patent carotid termini. Normal MCA and ACA origins. Anterior communicating artery, bilateral ACA branches, and bilateral MCA branches appear normal. Venous sinuses: Patent. Prominent combined right vein of vein of Labbe and vein of Trolard suspected, normal variant.  Anatomic variants: No arterial anatomic variations. Review of the MIP images confirms the above findings IMPRESSION: 1. Normal CTA head and neck. 2. Stable and normal CT appearance of the brain. 3. No acute findings identified in the neck or upper chest. Electronically Signed   By: Odessa Fleming M.D.   On: 11/07/2019 10:46    Procedures Procedures (including critical care time)  Medications Ordered in ED Medications  sodium chloride 0.9 % bolus 1,000 mL (0 mLs Intravenous Stopped 11/07/19 1344)  iohexol (OMNIPAQUE) 350 MG/ML injection 100 mL (100 mLs Intravenous Contrast Given 11/07/19 1016)    ED Course  I have reviewed the triage vital signs and the nursing notes.  Pertinent labs & imaging results that were available during my care of the patient were reviewed by me and considered in my medical decision making (see chart for details).    MDM Rules/Calculators/A&P  Joel Hudson is a 34 y.o. male with a past medical history significant for ADHD, asthma, and prior right clavicle surgery several months ago who presents with several months of intermittent right neck pain, right-sided headache, and intermittent speech difficulties, visual changes, and numbness and weakness in extremities. Patient reports that he has been focused on his wife's health over the last few months she has had "blood clot problems". He says that he has been having the symptoms but has been ignoring them. He reports that he has intermittent and separate episodes of numbness and weakness in the arms and legs separately. He also reports his right vision has been blurry at times as well as some difficulty with his speech. He is concerned he may be having small strokes. He reports he also has pain in his right neck that radiates into the right side of his head and face. He reports his blood pressures been up and down. He denies fevers or chills, congestion, cough, chest pain, palpitations, shortness of breath. He  denies significant nausea, vomiting, constipation, diarrhea, or urinary symptoms. He did not report any history of autoimmune disorders or any family history of MS.  On exam, patient has normal sensation and strength in extremities. Normal finger-nose-finger testing bilaterally. Pupils are symmetric and reactive normal extraocular movements. Symmetric smile. No carotid bruits appreciated. Clear speech on my exam. No focal neurologic deficits on my exam aside from possible red washout on right eye compared to left and looking at a red object. Lungs clear and chest nontender. Abdomen nontender. Neck had some mild tenderness on the right neck.  Given the patient's reports that he has had intermittent severe neck pain going into his head with neurologic deficits, in the setting of low blood pressures, I do feel any to rule out a dissection or aneurysm. He reports is gone for weeks and months but he is not able to focus on it. Do not feel there is any code stroke and he has no acute and constant neurologic deficits at this time. I am somewhat concerned about his report of the speech difficulties, visual changes, and the separated by time and location numbness and weakness in extremities. Given patient's age, low suspicion for temporal arteritis.  We will get CT of head and neck however he may end up needing MRI today to further evaluate for MS. Anticipate getting the images and reassessment. Anticipate touching base with neurology to discuss further management.  2:51 PM Work-up began to return.  Patient is UDS positive for amphetamines and he reports he takes Adderall for his ADHD.  No evidence of other drug use.  Other labs overall reassuring and he has slight similar elevation to his liver function.  CTA head and neck did not show any evidence of dissection, aneurysm, or acute abnormality.  No evidence of prior strokes.  Had a shared decision made conversation with patient about further work-up.  Given the  change in his red vision in his right eye compared to left in the setting of his discomfort, I did have some concern about possible MS however as he reports the symptoms are all doing better now and his CTs were reassuring, he feels comfortable following up with outpatient neurology given this been going on for weeks and even months.  He knows that he may need outpatient MRI to further characterize his symptoms.  We discussed this could be a complicated migraine as his symptom seems to be more related to when he is having headaches with  significant photophobia.  He does report increase in stress recently.  He agrees with plan for discharge and follow-up with his PCP and outpatient neurology.  He understood return precautions and was discharged in good condition with improved symptoms.    Final Clinical Impression(s) / ED Diagnoses Final diagnoses:  Acute nonintractable headache, unspecified headache type  Paresthesia  Photophobia    Rx / DC Orders ED Discharge Orders    None      Clinical Impression: 1. Acute nonintractable headache, unspecified headache type   2. Paresthesia   3. Photophobia     Disposition: Discharge  Condition: Good  I have discussed the results, Dx and Tx plan with the pt(& family if present). He/she/they expressed understanding and agree(s) with the plan. Discharge instructions discussed at great length. Strict return precautions discussed and pt &/or family have verbalized understanding of the instructions. No further questions at time of discharge.    New Prescriptions   No medications on file    Follow Up: [facility] [address] [place] [address] [phone]     [facility] NEUROLOGY [address] [place], [address] [place] [address] [phone]    [facility] [address] [id number] [address] [facility] [address] [phone]         Per Beagley, Canary Brim, MD 11/07/19 [phone]

## 2019-11-07 NOTE — Discharge Instructions (Signed)
Your history, exam, work-up today were concerning for atypical complicated migraine versus other types of headaches.  We did imaging of your head and neck which do not show any evidence of vascular problem such as aneurysm, dissection, or any evidence of prior stroke.  We did have a shared decision made conversation about further imaging today including MRI to rule out stroke or multiple sclerosis however given your improved symptoms while here, we did agree that you are appropriate for consideration of outpatient neurology follow-up and outpatient imaging if needed.  Please rest and stay hydrated.  Please follow-up with outpatient neurology.  If any symptoms change or worsen or persistent, please return to the nearest emergency department.

## 2019-11-09 ENCOUNTER — Encounter: Payer: Self-pay | Admitting: Neurology

## 2020-01-28 NOTE — Progress Notes (Deleted)
NEUROLOGY CONSULTATION NOTE  Joel Hudson MRN: [id number] DOB: 04/16/1985  Referring provider: Lynden Oxford, MD (ED referral) Primary care provider: No PCP  Reason for consult:  headache   Subjective:  Joel Hudson is a 34 year old ***-handed male with ADHD who presents for headache.  History supplemented by ED note.  ***.  He went to the ED on 11/07/2019.  He had a CT brain and CTA of head and neck which were personally reviewed and unremarkable. Labs such as CBC, CMP, TSH, Sars Coronavirus 2, and and UDS were unremarkable except for elevated AST/ALT 48/85 and positive amphetamine  Current NSAIDS/analgesics:  Hydrocodone-acetaminophen, oxycodone, ibuprofen Current triptans:  none Current ergotamine:  none Current anti-emetic:  none Current muscle relaxants:  Robaxin Current Antihypertensive medications:  none Current Antidepressant medications:  none Current Anticonvulsant medications:  none Current anti-CGRP:  none Current Vitamins/Herbal/Supplements:  none Current Antihistamines/Decongestants:  none Other therapy:  none Hormone/birth control:  none  Past NSAIDS/analgesics:  *** Past abortive triptans:  *** Past abortive ergotamine:  *** Past muscle relaxants:  *** Past anti-emetic:  *** Past antihypertensive medications:  *** Past antidepressant medications:  *** Past anticonvulsant medications:  *** Past anti-CGRP:  *** Past vitamins/Herbal/Supplements:  *** Past antihistamines/decongestants:  *** Other past therapies:  ***  Caffeine:  *** Alcohol:  *** Smoker:  *** Diet:  *** Exercise:  *** Depression:  ***; Anxiety:  *** Other pain:  *** Sleep hygiene:  *** Family history of headache:  ***      PAST MEDICAL HISTORY: Past Medical History:  Diagnosis Date  . ADHD   . Asthma    as a small child  . Narcotic dependence (HCC)     PAST SURGICAL HISTORY: Past Surgical History:  Procedure Laterality Date  . APPENDECTOMY    . HAND SURGERY  Left    fracture   . KNEE SURGERY    . ORIF CLAVICULAR FRACTURE Right 08/08/2019   Procedure: OPEN REDUCTION INTERNAL FIXATION (ORIF) RIGHT CLAVICLE FRACTURE;  Surgeon: Tarry Kos, MD;  Location: [facility] OR;  Service: Orthopedics;  Laterality: Right;  . TYMPANOSTOMY TUBE PLACEMENT      MEDICATIONS: Current Outpatient Medications on File Prior to Visit  Medication Sig Dispense Refill  . amphetamine-dextroamphetamine (ADDERALL) 20 MG tablet Take 20 mg by mouth daily as needed (extra focus).    Marland Kitchen HYDROcodone-acetaminophen (NORCO) 5-325 MG tablet Take 1-2 tablets by mouth 2 (two) times daily as needed. 20 tablet 0  . HYDROcodone-acetaminophen (NORCO/VICODIN) 5-325 MG tablet Take 1 tablet by mouth every 6 (six) hours as needed for severe pain. 10 tablet 0  . ibuprofen (ADVIL) 800 MG tablet Take 1 tablet (800 mg total) by mouth every 8 (eight) hours as needed. 30 tablet 2  . methocarbamol (ROBAXIN) 500 MG tablet Take 1 tablet (500 mg total) by mouth 2 (two) times daily as needed for muscle spasms. 20 tablet 0  . methocarbamol (ROBAXIN) 750 MG tablet Take 1 tablet (750 mg total) by mouth 2 (two) times daily as needed for muscle spasms. 20 tablet 3  . oxyCODONE-acetaminophen (PERCOCET) 5-325 MG tablet Take 1-2 tablets by mouth every 8 (eight) hours as needed for severe pain. 30 tablet 0   No current facility-administered medications on file prior to visit.    ALLERGIES: No Known Allergies  FAMILY HISTORY: No family history on file. ***.  SOCIAL HISTORY: Social History   Socioeconomic History  . Marital status: Married    Spouse name: Not on file  .  Number of children: Not on file  . Years of education: Not on file  . Highest education level: Not on file  Occupational History  . Not on file  Tobacco Use  . Smoking status: Never Smoker  . Smokeless tobacco: Current User    Types: Snuff  Vaping Use  . Vaping Use: Never used  Substance and Sexual Activity  . Alcohol use: Yes     Comment: occ  . Drug use: Not Currently    Types: Cocaine    Comment: heroine, amphetamines  . Sexual activity: Not on file  Other Topics Concern  . Not on file  Social History Narrative  . Not on file   Social Determinants of Health   Financial Resource Strain: Not on file  Food Insecurity: Not on file  Transportation Needs: Not on file  Physical Activity: Not on file  Stress: Not on file  Social Connections: Not on file  Intimate Partner Violence: Not on file    Objective:  *** General: No acute distress.  Patient appears well-groomed.   Head:  Normocephalic/atraumatic Eyes:  fundi examined but not visualized Neck: supple, no paraspinal tenderness, full range of motion Back: No paraspinal tenderness Heart: regular rate and rhythm Lungs: Clear to auscultation bilaterally. Vascular: No carotid bruits. Neurological Exam: Mental status: alert and oriented to person, place, and time, recent and remote memory intact, fund of knowledge intact, attention and concentration intact, speech fluent and not dysarthric, language intact. Cranial nerves: CN I: not tested CN II: pupils equal, round and reactive to light, visual fields intact CN III, IV, VI:  full range of motion, no nystagmus, no ptosis CN V: facial sensation intact. CN VII: upper and lower face symmetric CN VIII: hearing intact CN IX, X: gag intact, uvula midline CN XI: sternocleidomastoid and trapezius muscles intact CN XII: tongue midline Bulk & Tone: normal, no fasciculations. Motor:  muscle strength 5/5 throughout Sensation:  Pinprick, temperature and vibratory sensation intact. Deep Tendon Reflexes:  2+ throughout,  toes downgoing.   Finger to nose testing:  Without dysmetria.   Heel to shin:  Without dysmetria.   Gait:  Normal station and stride.  Romberg negative.  Assessment/Plan:   ***    Thank you for allowing me to take part in the care of this patient.  Shon Millet, DO

## 2020-01-29 ENCOUNTER — Encounter: Payer: Self-pay | Admitting: Neurology

## 2020-01-29 ENCOUNTER — Ambulatory Visit: Payer: Self-pay | Admitting: Neurology

## 2020-01-29 DIAGNOSIS — Z029 Encounter for administrative examinations, unspecified: Secondary | ICD-10-CM

## 2020-11-25 IMAGING — CT CT ANGIO NECK
1 of 11 series · 5 of 33 positions shown · IV contrast (Omnipaque)
Comparison: Head and cervical spine CT 07/29/2019.

CLINICAL DATA: 33-year-old male with possible vertebral artery
dissection. Headache and right neck pain. Intermittent numbness and
weakness in the extremities. Transient vision and speech changes.

EXAM:
CT ANGIOGRAPHY HEAD AND NECK
TECHNIQUE: Multidetector CT imaging of the head and neck was performed using
the standard protocol during bolus administration of intravenous
contrast. Multiplanar CT image reconstructions and MIPs were
obtained to evaluate the vascular anatomy. Carotid stenosis
measurements (when applicable) are obtained utilizing NASCET
criteria, using the distal internal carotid diameter as the
denominator.
CONTRAST:  100mL OMNIPAQUE IOHEXOL 350 MG/ML SOLN

[Series 11: axial thin · axial · 0.59mm/px · z∈[+950,+1210]mm · 5 of 392 slices shown]
[im 66/392  soft-tissue]
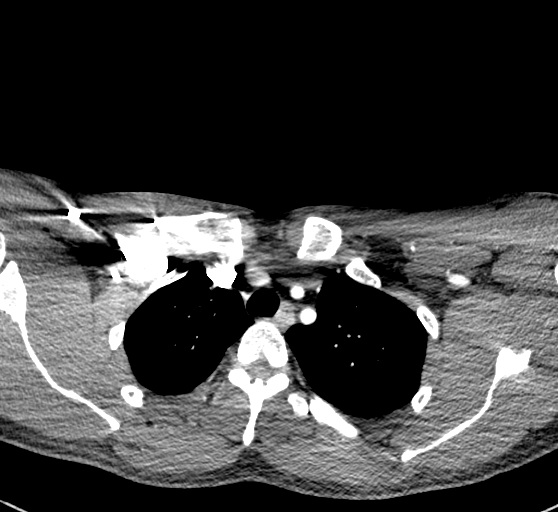
[im 131/392  bone]
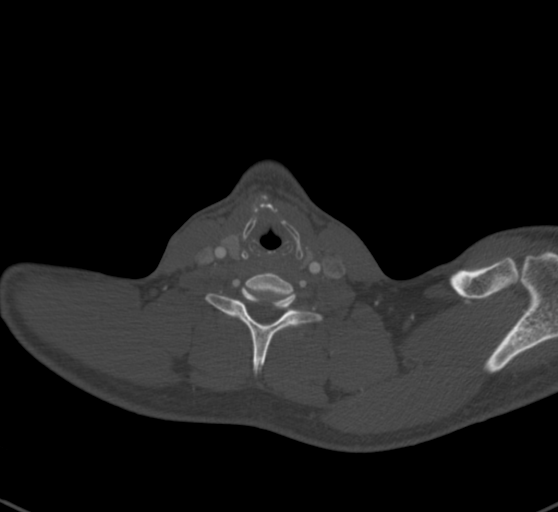
[im 196/392  soft-tissue]
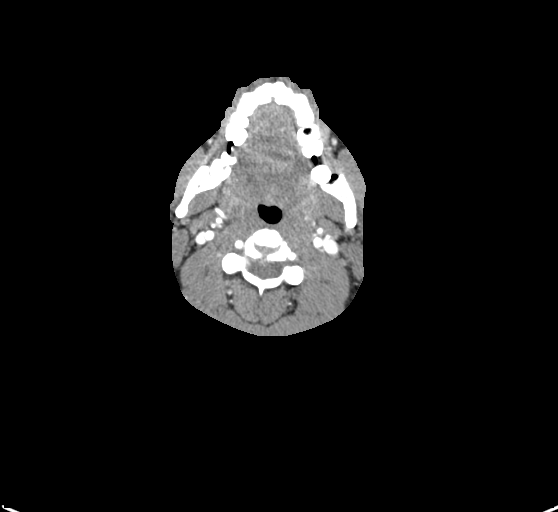
[im 261/392  bone]
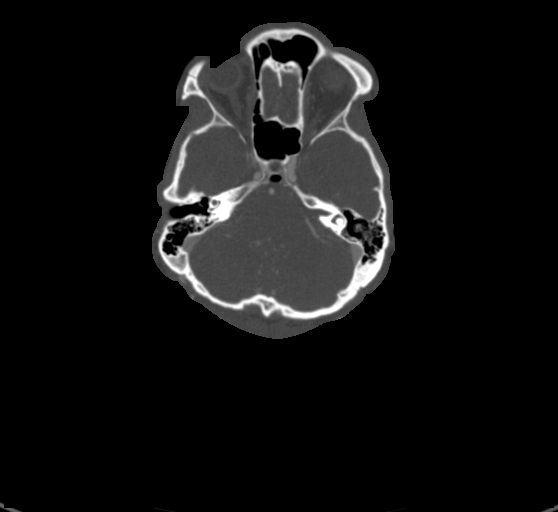
[im 326/392  soft-tissue]
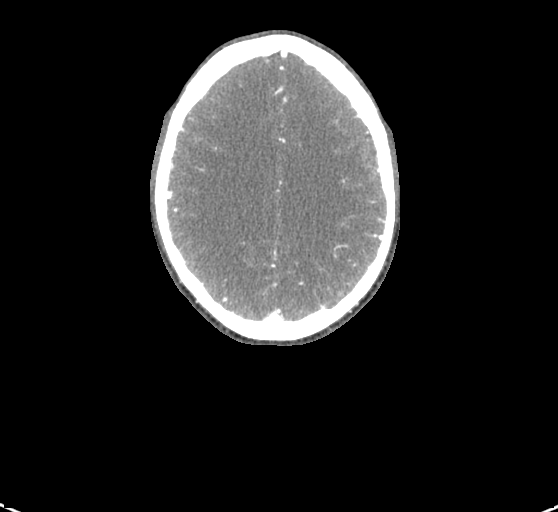

[5 of 33 positions shown; findings below may reference images not displayed]

FINDINGS: CT HEAD

Brain: Stable cerebral volume. Mild asymmetry of the lateral
ventricles appears to be normal anatomic variation. No midline
shift, ventriculomegaly, mass effect, evidence of mass lesion,
intracranial hemorrhage or evidence of cortically based acute
infarction. Gray-white matter differentiation is within normal
limits throughout the brain.

Calvarium and skull base: No acute osseous abnormality identified.

Paranasal sinuses: Visualized paranasal sinuses and mastoids are
stable and well pneumatized.

Orbits: Visualized orbits and scalp soft tissues are within normal
limits.

CTA NECK

Skeleton: Stable straightening of cervical lordosis. Cervicothoracic
junction alignment is within normal limits. Bilateral posterior
element alignment is within normal limits. Cervical vertebrae appear
stable and intact. Sequelae of right clavicle ORIF. No acute osseous
abnormality identified.

Upper chest: Negative.

Other neck: Negative.

Aortic arch: 3 vessel arch configuration.  No arch atherosclerosis.

Right carotid system: Obscured bifurcation of the brachiocephalic
artery due to dense right subclavian venous contrast streak. But the
visible proximal right CCA, and the remainder of the cervical right
carotids appear normal.

Left carotid system: Normal.

Vertebral arteries:
Portion of the proximal right subclavian artery is also obscured by
dense right subclavian venous contrast but the right vertebral
artery origin is visible and normal. The right vertebral is normal
to the skull base.

Normal proximal left subclavian artery and left vertebral artery
origin. Fairly codominant left vertebral artery is patent and normal
to the skull base.

CTA HEAD

Posterior circulation: Normal distal vertebral arteries, PICA
origins, vertebrobasilar junction. Patent basilar artery without
stenosis. Patent SCA and PCA origins. Posterior communicating
arteries are diminutive or absent. Bilateral PCA branches are within
normal limits.

Anterior circulation: Both ICA siphons are patent and normal. Normal
ophthalmic artery origins. Patent carotid termini. Normal MCA and
ACA origins. Anterior communicating artery, bilateral ACA branches,
and bilateral MCA branches appear normal.

Venous sinuses: Patent. Prominent combined right vein of vein of
Labbe and vein of Trolard suspected, normal variant.

Anatomic variants: No arterial anatomic variations.

Review of the MIP images confirms the above findings
IMPRESSION: 1. Normal CTA head and neck.
2. Stable and normal CT appearance of the brain.
3. No acute findings identified in the neck or upper chest.

## 2020-11-25 IMAGING — CT CT ANGIO HEAD
1 of 8 series · 9 of 33 positions shown · IV contrast (omnipaque)
Comparison: Head and cervical spine CT 07/29/2019.

CLINICAL DATA: 33-year-old male with possible vertebral artery
dissection. Headache and right neck pain. Intermittent numbness and
weakness in the extremities. Transient vision and speech changes.

EXAM:
CT ANGIOGRAPHY HEAD AND NECK
TECHNIQUE: Multidetector CT imaging of the head and neck was performed using
the standard protocol during bolus administration of intravenous
contrast. Multiplanar CT image reconstructions and MIPs were
obtained to evaluate the vascular anatomy. Carotid stenosis
measurements (when applicable) are obtained utilizing NASCET
criteria, using the distal internal carotid diameter as the
denominator.
CONTRAST:  100mL OMNIPAQUE IOHEXOL 350 MG/ML SOLN

[Series 11: axial thin · axial · 0.59mm/px · z∈[+924,+1236]mm · 9 of 392 slices shown]
[im 40/392  soft-tissue]
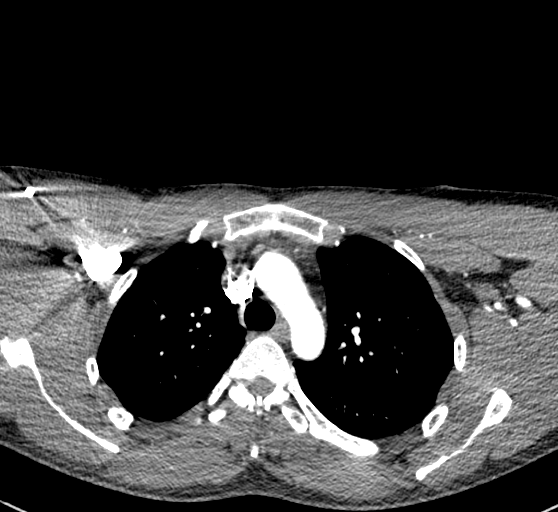
[im 79/392  bone]
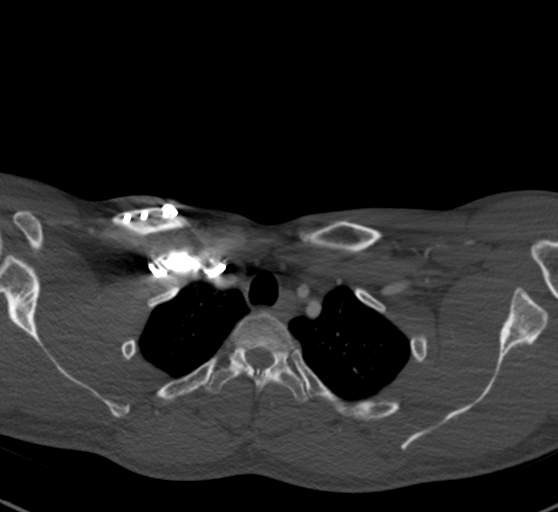
[im 118/392  soft-tissue]
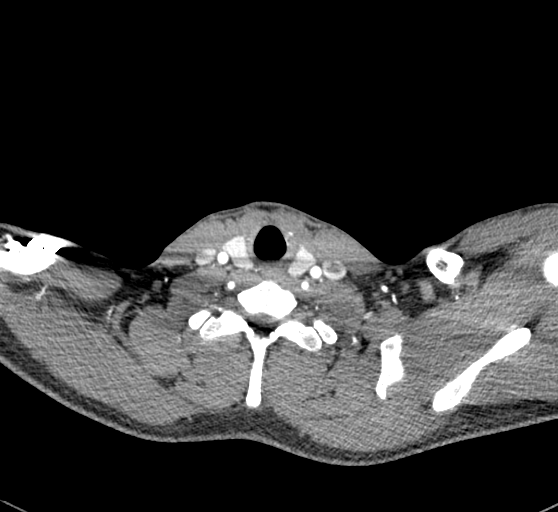
[im 157/392  bone]
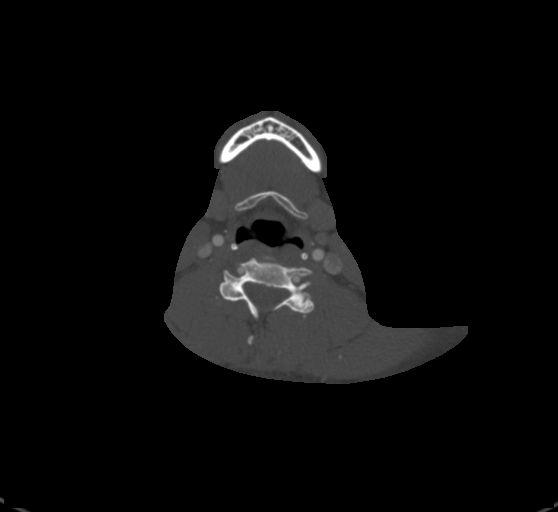
[im 196/392  soft-tissue]
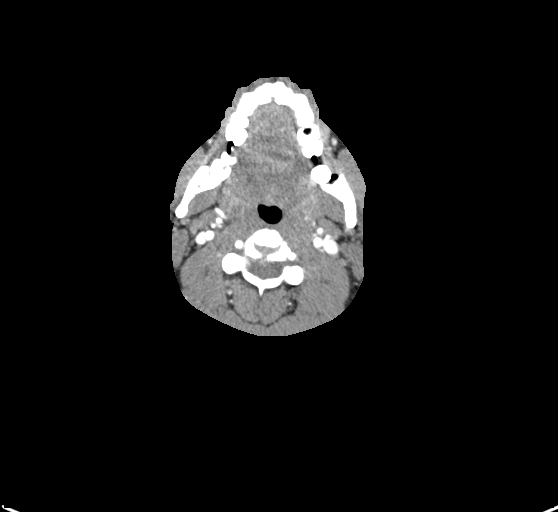
[im 235/392  bone]
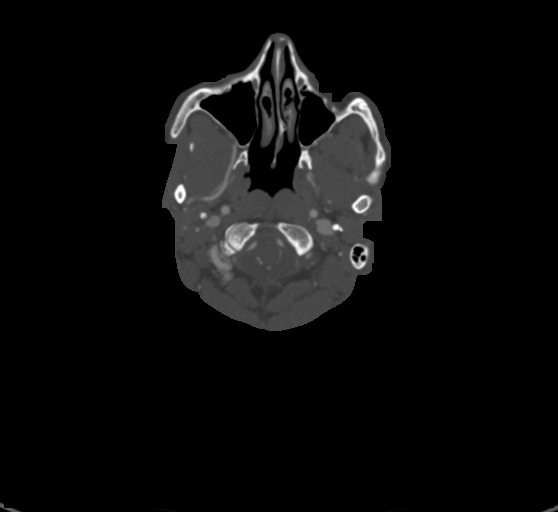
[im 274/392  soft-tissue]
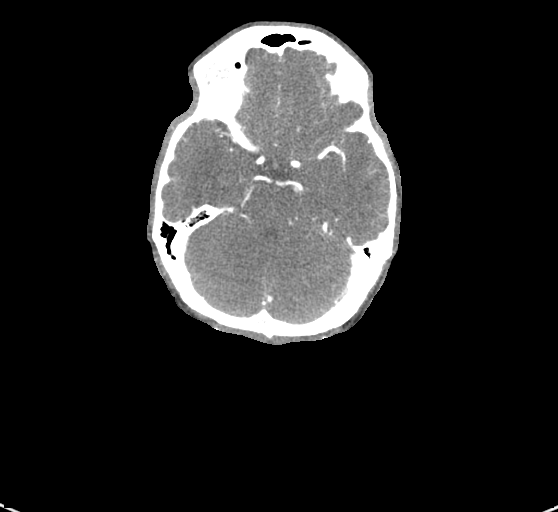
[im 313/392  bone]
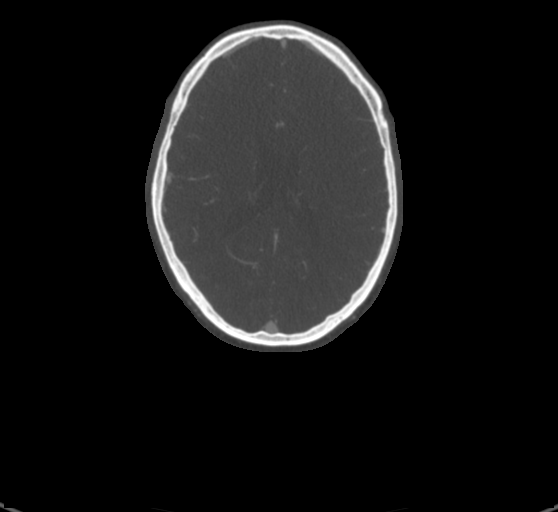
[im 352/392  soft-tissue]
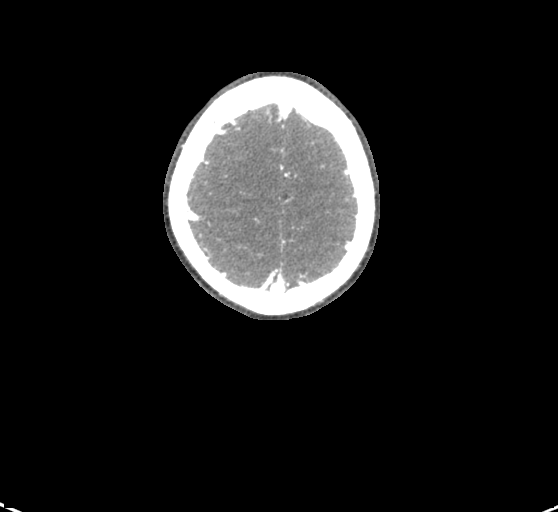

[9 of 33 positions shown; findings below may reference images not displayed]

FINDINGS: CT HEAD

Brain: Stable cerebral volume. Mild asymmetry of the lateral
ventricles appears to be normal anatomic variation. No midline
shift, ventriculomegaly, mass effect, evidence of mass lesion,
intracranial hemorrhage or evidence of cortically based acute
infarction. Gray-white matter differentiation is within normal
limits throughout the brain.

Calvarium and skull base: No acute osseous abnormality identified.

Paranasal sinuses: Visualized paranasal sinuses and mastoids are
stable and well pneumatized.

Orbits: Visualized orbits and scalp soft tissues are within normal
limits.

CTA NECK

Skeleton: Stable straightening of cervical lordosis. Cervicothoracic
junction alignment is within normal limits. Bilateral posterior
element alignment is within normal limits. Cervical vertebrae appear
stable and intact. Sequelae of right clavicle ORIF. No acute osseous
abnormality identified.

Upper chest: Negative.

Other neck: Negative.

Aortic arch: 3 vessel arch configuration.  No arch atherosclerosis.

Right carotid system: Obscured bifurcation of the brachiocephalic
artery due to dense right subclavian venous contrast streak. But the
visible proximal right CCA, and the remainder of the cervical right
carotids appear normal.

Left carotid system: Normal.

Vertebral arteries:
Portion of the proximal right subclavian artery is also obscured by
dense right subclavian venous contrast but the right vertebral
artery origin is visible and normal. The right vertebral is normal
to the skull base.

Normal proximal left subclavian artery and left vertebral artery
origin. Fairly codominant left vertebral artery is patent and normal
to the skull base.

CTA HEAD

Posterior circulation: Normal distal vertebral arteries, PICA
origins, vertebrobasilar junction. Patent basilar artery without
stenosis. Patent SCA and PCA origins. Posterior communicating
arteries are diminutive or absent. Bilateral PCA branches are within
normal limits.

Anterior circulation: Both ICA siphons are patent and normal. Normal
ophthalmic artery origins. Patent carotid termini. Normal MCA and
ACA origins. Anterior communicating artery, bilateral ACA branches,
and bilateral MCA branches appear normal.

Venous sinuses: Patent. Prominent combined right vein of vein of
Labbe and vein of Trolard suspected, normal variant.

Anatomic variants: No arterial anatomic variations.

Review of the MIP images confirms the above findings
IMPRESSION: 1. Normal CTA head and neck.
2. Stable and normal CT appearance of the brain.
3. No acute findings identified in the neck or upper chest.
